# Patient Record
Sex: Female | Born: 1980 | Race: White | Hispanic: No | Marital: Married | Smoking: Never smoker
Health system: Southern US, Community
[De-identification: ages and names within clinical notes are randomized; demographics above are authoritative.]

## PROBLEM LIST (undated history)

## (undated) DIAGNOSIS — Z789 Other specified health status: Secondary | ICD-10-CM

## (undated) DIAGNOSIS — Z8619 Personal history of other infectious and parasitic diseases: Secondary | ICD-10-CM

## (undated) HISTORY — DX: Personal history of other infectious and parasitic diseases: Z86.19

## (undated) HISTORY — PX: WISDOM TOOTH EXTRACTION: SHX21

---
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
## 2005-12-25 ENCOUNTER — Other Ambulatory Visit: Admission: RE | Admit: 2005-12-25 | Discharge: 2005-12-25 | Payer: Self-pay | Admitting: Obstetrics and Gynecology

## 2008-05-08 ENCOUNTER — Inpatient Hospital Stay (HOSPITAL_COMMUNITY): Admission: AD | Admit: 2008-05-08 | Discharge: 2008-05-11 | Payer: Self-pay | Admitting: Obstetrics and Gynecology

## 2010-01-31 IMAGING — US US FETAL BPP W/O NONSTRESS
1 series · 14 of 17 positions shown · non-contrast
Comparison: none

OBSTETRICAL ULTRASOUND:
 This ultrasound exam was performed in the [HOSPITAL] Ultrasound Department.  The OB US report was generated in the AS system, and faxed to the ordering physician.  This report is also available in [REDACTED] PACS.

[Series 1: us fetal bpp w/o nonstress · non-contrast · 14 of 17 slices shown]
[im 1/17]
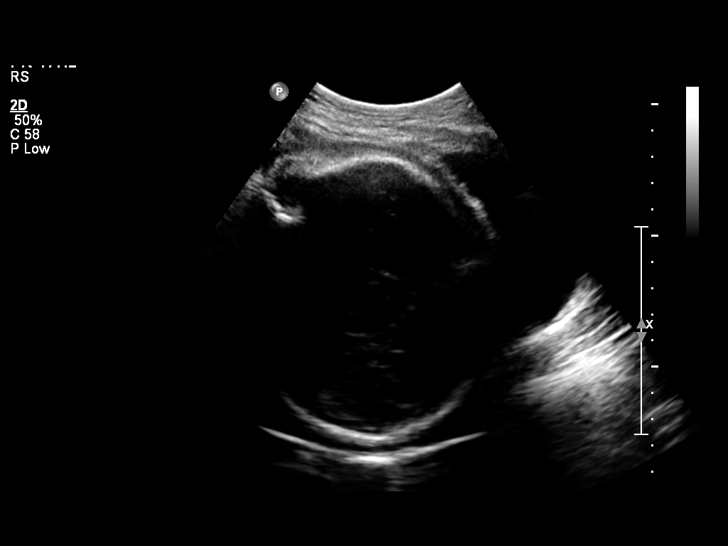
[im 2/17]
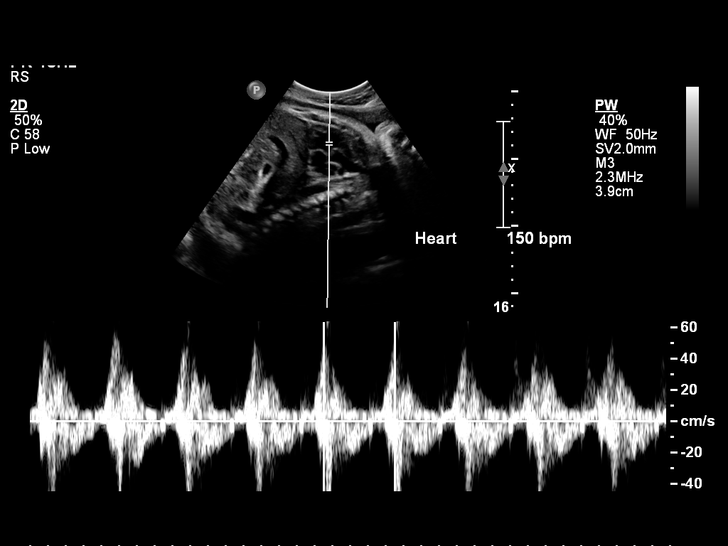
[im 4/17]
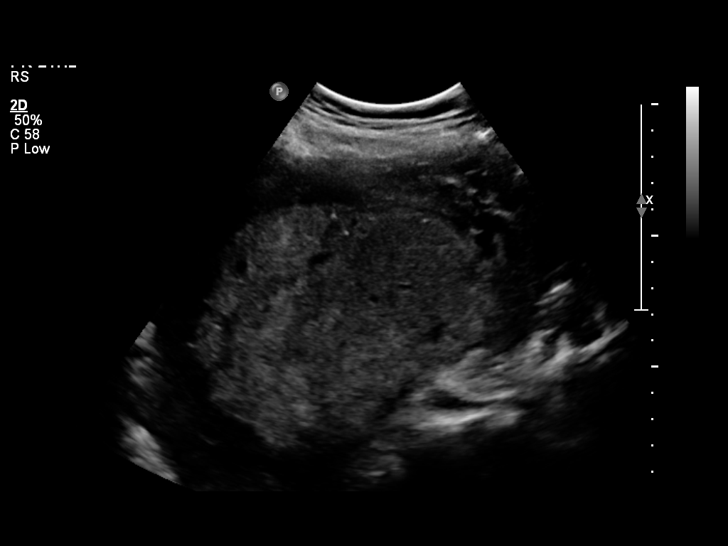
[im 5/17]
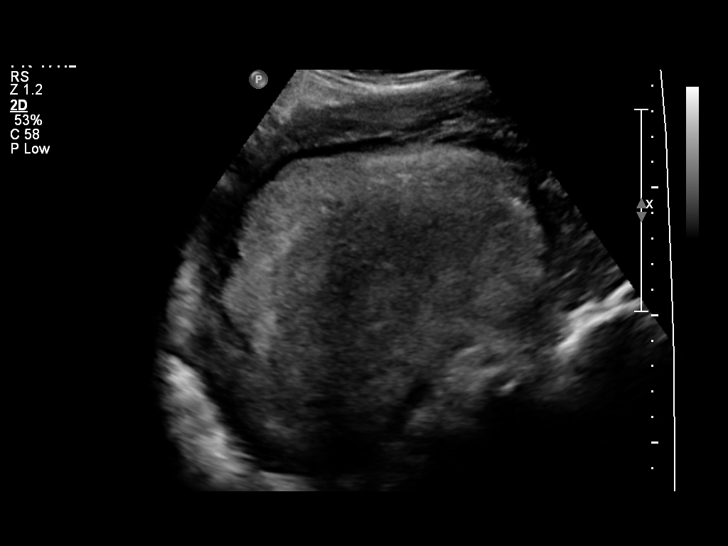
[im 6/17]
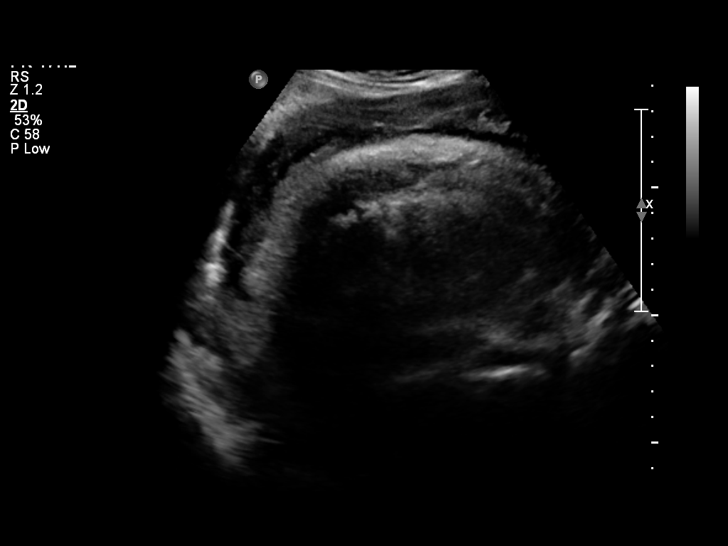
[im 7/17]
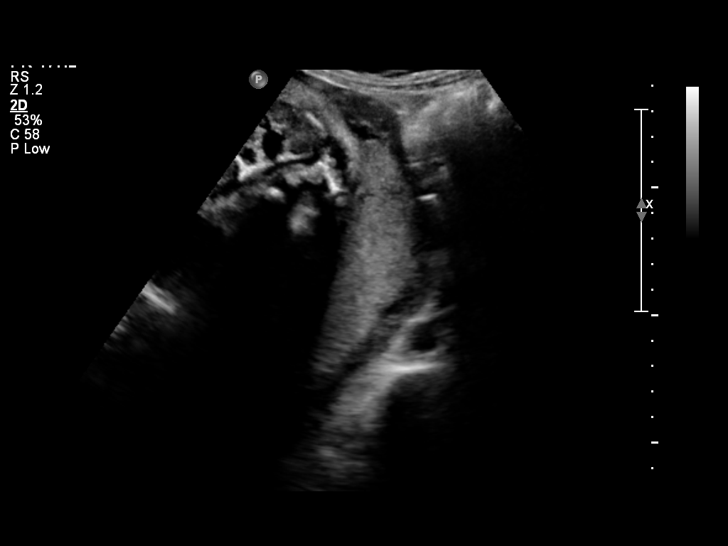
[im 8/17]
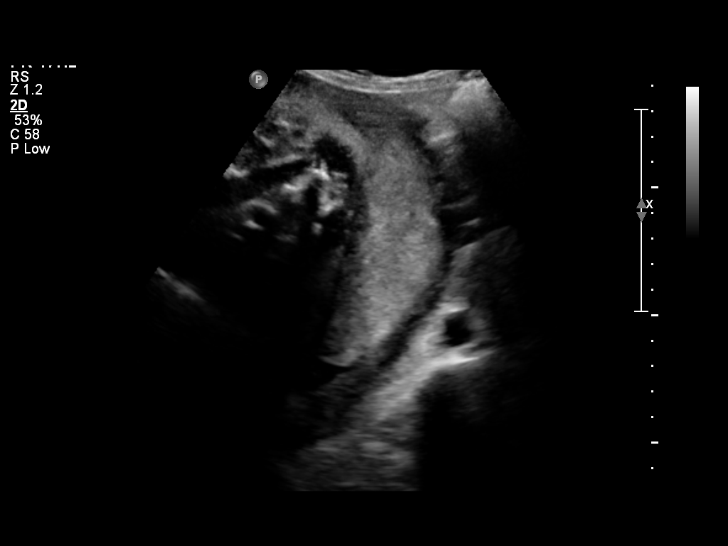
[im 10/17]
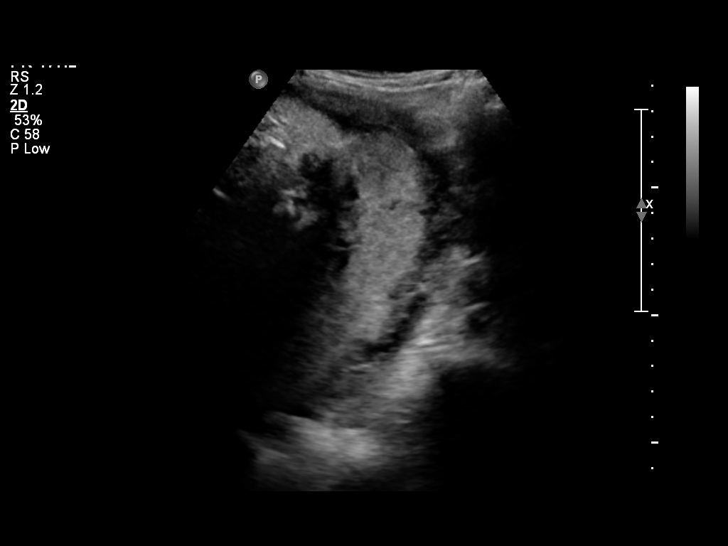
[im 11/17]
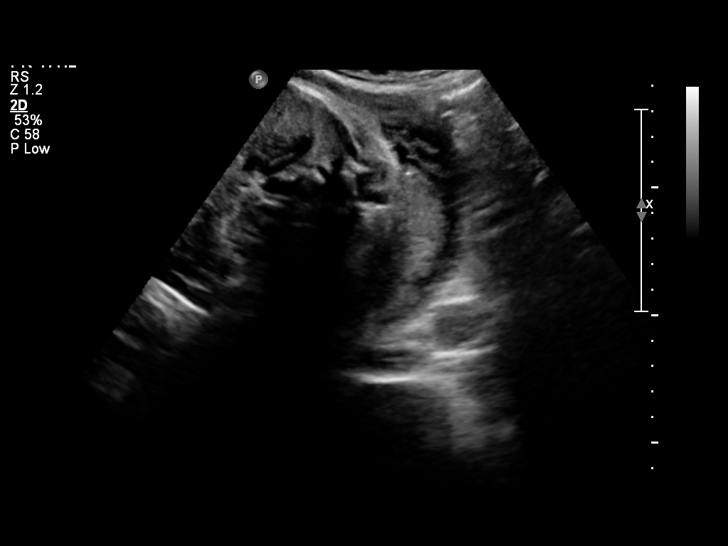
[im 12/17]
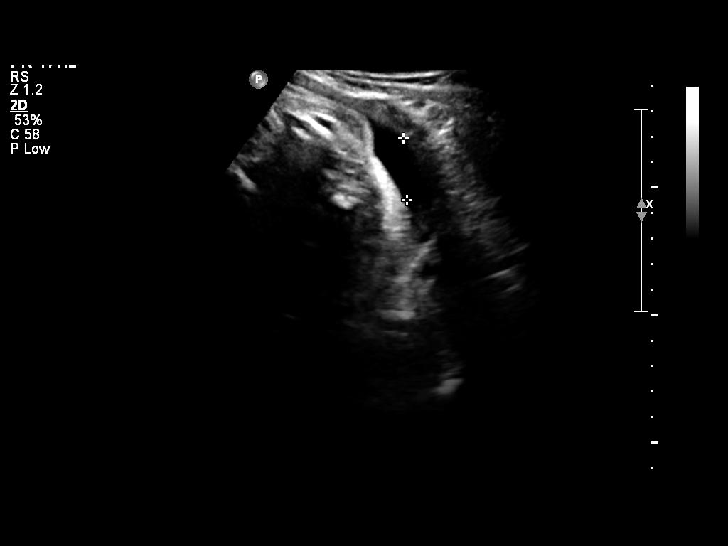
[im 13/17]
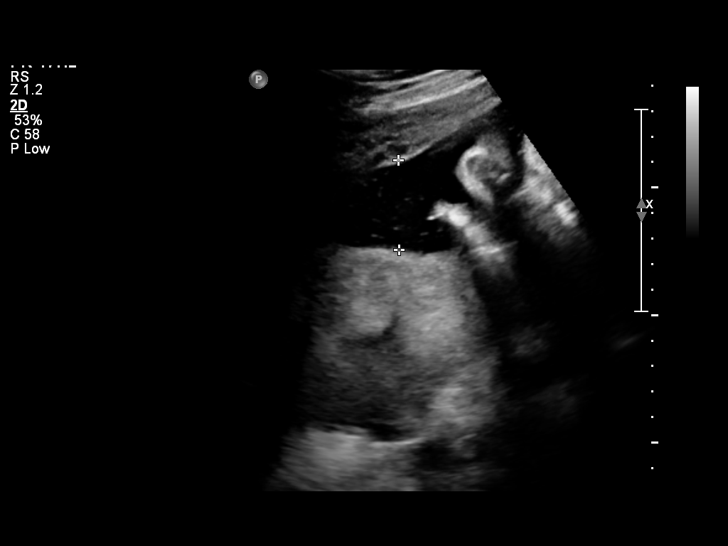
[im 14/17]
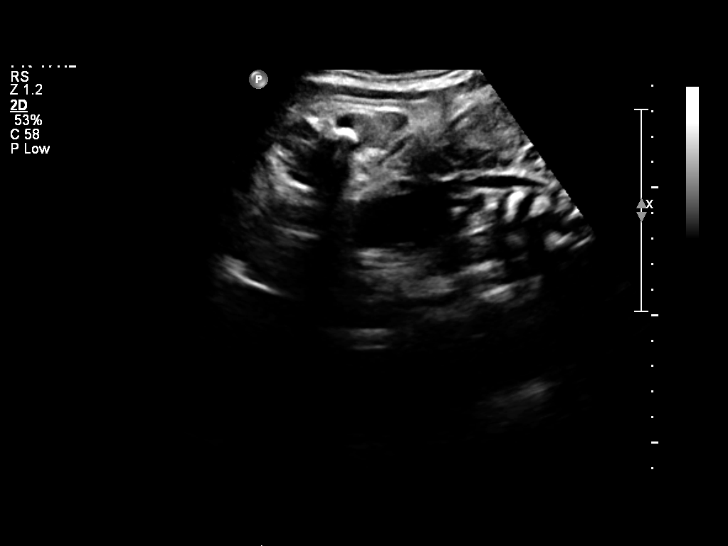
[im 16/17]
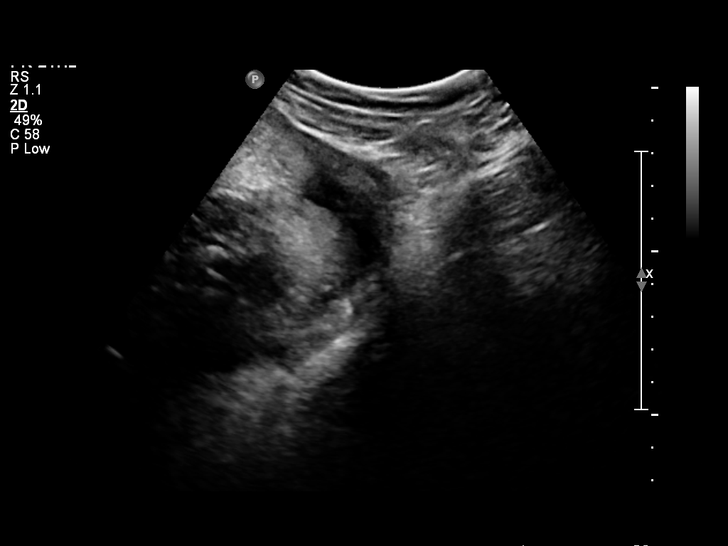
[im 17/17]
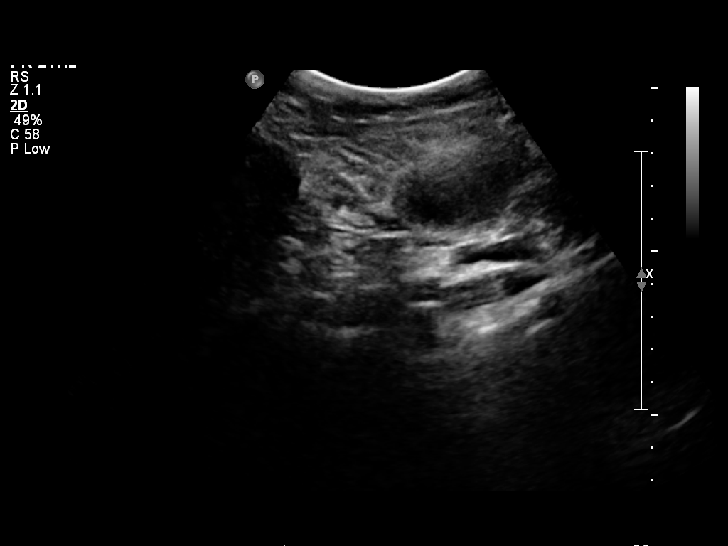

[14 of 17 positions shown; findings below may reference images not displayed]

IMPRESSION: See AS Obstetric US report.

## 2010-10-06 ENCOUNTER — Inpatient Hospital Stay (HOSPITAL_COMMUNITY)
Admission: AD | Admit: 2010-10-06 | Discharge: 2010-10-07 | Payer: Self-pay | Source: Home / Self Care | Attending: Obstetrics and Gynecology | Admitting: Obstetrics and Gynecology

## 2010-10-08 LAB — CBC
HCT: 38.7 % (ref 36.0–46.0)
HCT: 31.3 % — ABNORMAL LOW (ref 36.0–46.0)
Hemoglobin: 13.5 g/dL (ref 12.0–15.0)
Hemoglobin: 10.4 g/dL — ABNORMAL LOW (ref 12.0–15.0)
MCH: 32.3 pg (ref 26.0–34.0)
MCH: 31.5 pg (ref 26.0–34.0)
MCHC: 34.9 g/dL (ref 30.0–36.0)
MCHC: 33.2 g/dL (ref 30.0–36.0)
MCV: 92.6 fL (ref 78.0–100.0)
MCV: 94.8 fL (ref 78.0–100.0)
Platelets: 222 10*3/uL (ref 150–400)
Platelets: 193 10*3/uL (ref 150–400)
RBC: 4.18 MIL/uL (ref 3.87–5.11)
RBC: 3.3 MIL/uL — ABNORMAL LOW (ref 3.87–5.11)
RDW: 12.6 % (ref 11.5–15.5)
RDW: 13 % (ref 11.5–15.5)
WBC: 18.4 10*3/uL — ABNORMAL HIGH (ref 4.0–10.5)
WBC: 11.7 10*3/uL — ABNORMAL HIGH (ref 4.0–10.5)

## 2010-10-08 LAB — ABO/RH: ABO/RH(D): O POS

## 2010-10-08 LAB — RPR: RPR Ser Ql: NONREACTIVE

## 2012-04-22 LAB — OB RESULTS CONSOLE ABO/RH: RH Type: POSITIVE

## 2012-04-22 LAB — OB RESULTS CONSOLE RPR: RPR: NONREACTIVE

## 2012-04-22 LAB — OB RESULTS CONSOLE GC/CHLAMYDIA
Chlamydia: NEGATIVE
Gonorrhea: NEGATIVE

## 2012-04-22 LAB — OB RESULTS CONSOLE HEPATITIS B SURFACE ANTIGEN: Hepatitis B Surface Ag: NEGATIVE

## 2012-04-22 LAB — OB RESULTS CONSOLE HIV ANTIBODY (ROUTINE TESTING): HIV: NONREACTIVE

## 2012-04-22 LAB — OB RESULTS CONSOLE RUBELLA ANTIBODY, IGM: Rubella: IMMUNE

## 2012-04-22 LAB — OB RESULTS CONSOLE ANTIBODY SCREEN: Antibody Screen: NEGATIVE

## 2012-09-23 NOTE — L&D Delivery Note (Signed)
Delivery Note At  a viable female delivered via NSVD. Apgars 9 and 9 weight pending  Placenta status: spontaneously intact with 3 vessel cord  Cord:  with the following complications: short cord  Anesthesia: None  Episiotomy: none Lacerations: none Suture Repair: not applicable Est. Blood Loss (mL): 300  Mom to postpartum.  Baby to nursery-stable.  GREWAL,MICHELLE L 12/02/2012, 6:06 AM

## 2012-11-03 LAB — OB RESULTS CONSOLE GBS: GBS: NEGATIVE

## 2012-11-26 ENCOUNTER — Telehealth (HOSPITAL_COMMUNITY): Payer: Self-pay | Admitting: *Deleted

## 2012-11-26 ENCOUNTER — Encounter (HOSPITAL_COMMUNITY): Payer: Self-pay | Admitting: *Deleted

## 2012-11-26 NOTE — Telephone Encounter (Signed)
Preadmission screen  

## 2012-12-02 ENCOUNTER — Encounter (HOSPITAL_COMMUNITY): Payer: Self-pay

## 2012-12-02 ENCOUNTER — Inpatient Hospital Stay (HOSPITAL_COMMUNITY)
Admission: AD | Admit: 2012-12-02 | Discharge: 2012-12-03 | DRG: 775 | Disposition: A | Discharge status: Home / Self Care | Payer: Commercial Managed Care - PPO | Source: Ambulatory Visit | Attending: Obstetrics and Gynecology | Admitting: Obstetrics and Gynecology

## 2012-12-02 HISTORY — DX: Other specified health status: Z78.9

## 2012-12-02 LAB — CBC
HCT: 34.9 % — ABNORMAL LOW (ref 36.0–46.0)
Hemoglobin: 12 g/dL (ref 12.0–15.0)
MCV: 89.5 fL (ref 78.0–100.0)
Platelets: 216 10*3/uL (ref 150–400)
RBC: 3.9 MIL/uL (ref 3.87–5.11)
WBC: 15.5 10*3/uL — ABNORMAL HIGH (ref 4.0–10.5)

## 2012-12-02 MED ORDER — BENZOCAINE-MENTHOL 20-0.5 % EX AERO: 1.0000 "application " | INHALATION_SPRAY | CUTANEOUS | Status: DC | PRN

## 2012-12-02 MED ORDER — LIDOCAINE HCL (PF) 1 % IJ SOLN
30.0000 mL | INTRAMUSCULAR | Status: DC | PRN
  Filled 2012-12-02: qty 30

## 2012-12-02 MED ORDER — DIBUCAINE 1 % RE OINT
1.0000 "application " | TOPICAL_OINTMENT | RECTAL | Status: DC | PRN
  Administered 2012-12-02: 1 via RECTAL
  Filled 2012-12-02: qty 28

## 2012-12-02 MED ORDER — OXYTOCIN 40 UNITS IN LACTATED RINGERS INFUSION - SIMPLE MED: 1.0000 m[IU]/min | INTRAVENOUS | Status: DC

## 2012-12-02 MED ORDER — SIMETHICONE 80 MG PO CHEW: 80.0000 mg | CHEWABLE_TABLET | ORAL | Status: DC | PRN

## 2012-12-02 MED ORDER — FLEET ENEMA 7-19 GM/118ML RE ENEM: 1.0000 | ENEMA | Freq: Every day | RECTAL | Status: DC | PRN

## 2012-12-02 MED ORDER — OXYCODONE-ACETAMINOPHEN 5-325 MG PO TABS
1.0000 | ORAL_TABLET | ORAL | Status: DC | PRN
  Administered 2012-12-02: 1 via ORAL
  Filled 2012-12-02: qty 1

## 2012-12-02 MED ORDER — ONDANSETRON HCL 4 MG/2ML IJ SOLN
4.0000 mg | Freq: Four times a day (QID) | INTRAMUSCULAR | Status: DC | PRN
  Administered 2012-12-02: 4 mg via INTRAVENOUS
  Filled 2012-12-02: qty 2

## 2012-12-02 MED ORDER — PRENATAL MULTIVITAMIN CH
1.0000 | ORAL_TABLET | Freq: Every day | ORAL | Status: DC
  Administered 2012-12-02: 1 via ORAL
  Filled 2012-12-02 (×2): qty 1

## 2012-12-02 MED ORDER — LANOLIN HYDROUS EX OINT: TOPICAL_OINTMENT | CUTANEOUS | Status: DC | PRN

## 2012-12-02 MED ORDER — ZOLPIDEM TARTRATE 5 MG PO TABS: 5.0000 mg | ORAL_TABLET | Freq: Every evening | ORAL | Status: DC | PRN

## 2012-12-02 MED ORDER — MEDROXYPROGESTERONE ACETATE 150 MG/ML IM SUSP: 150.0000 mg | INTRAMUSCULAR | Status: DC | PRN

## 2012-12-02 MED ORDER — ACETAMINOPHEN 325 MG PO TABS: 650.0000 mg | ORAL_TABLET | ORAL | Status: DC | PRN

## 2012-12-02 MED ORDER — OXYTOCIN BOLUS FROM INFUSION
500.0000 mL | INTRAVENOUS | Status: DC
  Administered 2012-12-02: 500 mL via INTRAVENOUS

## 2012-12-02 MED ORDER — CITRIC ACID-SODIUM CITRATE 334-500 MG/5ML PO SOLN: 30.0000 mL | ORAL | Status: DC | PRN

## 2012-12-02 MED ORDER — MEASLES, MUMPS & RUBELLA VAC ~~LOC~~ INJ: 0.5000 mL | INJECTION | Freq: Once | SUBCUTANEOUS | Status: DC

## 2012-12-02 MED ORDER — DIPHENHYDRAMINE HCL 25 MG PO CAPS: 25.0000 mg | ORAL_CAPSULE | Freq: Four times a day (QID) | ORAL | Status: DC | PRN

## 2012-12-02 MED ORDER — IBUPROFEN 600 MG PO TABS
600.0000 mg | ORAL_TABLET | Freq: Four times a day (QID) | ORAL | Status: DC
  Administered 2012-12-02 – 2012-12-03 (×4): 600 mg via ORAL
  Filled 2012-12-02 (×4): qty 1

## 2012-12-02 MED ORDER — ONDANSETRON HCL 4 MG PO TABS: 4.0000 mg | ORAL_TABLET | ORAL | Status: DC | PRN

## 2012-12-02 MED ORDER — LACTATED RINGERS IV SOLN: 500.0000 mL | INTRAVENOUS | Status: DC | PRN

## 2012-12-02 MED ORDER — ONDANSETRON HCL 4 MG/2ML IJ SOLN: 4.0000 mg | INTRAMUSCULAR | Status: DC | PRN

## 2012-12-02 MED ORDER — WITCH HAZEL-GLYCERIN EX PADS
1.0000 "application " | MEDICATED_PAD | CUTANEOUS | Status: DC | PRN
  Administered 2012-12-02: 1 via TOPICAL

## 2012-12-02 MED ORDER — SENNOSIDES-DOCUSATE SODIUM 8.6-50 MG PO TABS
2.0000 | ORAL_TABLET | Freq: Every day | ORAL | Status: DC
  Administered 2012-12-02: 2 via ORAL

## 2012-12-02 MED ORDER — OXYCODONE-ACETAMINOPHEN 5-325 MG PO TABS: 1.0000 | ORAL_TABLET | ORAL | Status: DC | PRN

## 2012-12-02 MED ORDER — LACTATED RINGERS IV SOLN: INTRAVENOUS | Status: DC

## 2012-12-02 MED ORDER — TETANUS-DIPHTH-ACELL PERTUSSIS 5-2.5-18.5 LF-MCG/0.5 IM SUSP: 0.5000 mL | Freq: Once | INTRAMUSCULAR | Status: DC

## 2012-12-02 MED ORDER — IBUPROFEN 600 MG PO TABS
600.0000 mg | ORAL_TABLET | Freq: Four times a day (QID) | ORAL | Status: DC | PRN
Start: 2012-12-02 — End: 2012-12-02
  Administered 2012-12-02: 600 mg via ORAL
  Filled 2012-12-02: qty 1

## 2012-12-02 MED ORDER — FLEET ENEMA 7-19 GM/118ML RE ENEM: 1.0000 | ENEMA | RECTAL | Status: DC | PRN

## 2012-12-02 MED ORDER — OXYTOCIN 40 UNITS IN LACTATED RINGERS INFUSION - SIMPLE MED
62.5000 mL/h | INTRAVENOUS | Status: DC
  Filled 2012-12-02: qty 1000

## 2012-12-02 MED ORDER — BISACODYL 10 MG RE SUPP: 10.0000 mg | Freq: Every day | RECTAL | Status: DC | PRN

## 2012-12-02 NOTE — H&P (Signed)
32 year old G 3 P 2002 at 26 w 3 days presents in active labor. PNC See [place]. NSVD x 2 GBBS is negative Afebrile Vital signs stable General alert and oriented Lung CTAB Car RRR Cervix is C/rim /-1 AROM Clear Vertex  IMPRESSION: IUP at 40 w 3 days  PLAN: Labor Anticipate NSVD

## 2012-12-02 NOTE — Progress Notes (Signed)
UR chart review completed.  

## 2012-12-02 NOTE — MAU Note (Signed)
Ctx every 4 minutes since 0230 am. Denies leaking of fluid or vaginal bleeding. Was 3 cm in office yesterday.

## 2012-12-02 NOTE — Progress Notes (Signed)
Post Partum Day 0 Subjective: no complaints, up ad lib and tolerating PO  Objective: Blood pressure 120/68, pulse 85, temperature 98.3 F (36.8 C), temperature source Oral, resp. rate 20, height 5\' 4"  (1.626 m), weight 153 lb (69.4 kg), last menstrual period 02/23/2012, SpO2 98.00%, unknown if currently breastfeeding.  Physical Exam:  General: alert and cooperative Lochia: appropriate Uterine Fundus: firm Incision: perineum intact DVT Evaluation: No evidence of DVT seen on physical exam. No significant calf/ankle edema.   Recent Labs  12/02/12 0454  HGB 12.0  HCT 34.9*    Assessment/Plan: Plan for discharge tomorrow   LOS: 0 days   CURTIS,CAROL G 12/02/2012, 8:36 AM

## 2012-12-03 ENCOUNTER — Inpatient Hospital Stay (HOSPITAL_COMMUNITY): Admission: RE | Admit: 2012-12-03 | Payer: Commercial Managed Care - PPO | Source: Ambulatory Visit

## 2012-12-03 LAB — CBC
Hemoglobin: 10.8 g/dL — ABNORMAL LOW (ref 12.0–15.0)
MCHC: 32.8 g/dL (ref 30.0–36.0)
Platelets: 204 10*3/uL (ref 150–400)
RBC: 3.59 MIL/uL — ABNORMAL LOW (ref 3.87–5.11)

## 2012-12-03 MED ORDER — IBUPROFEN 600 MG PO TABS: 600.0000 mg | ORAL_TABLET | Freq: Four times a day (QID) | ORAL | Status: AC

## 2012-12-03 MED ORDER — OXYCODONE-ACETAMINOPHEN 5-325 MG PO TABS: 1.0000 | ORAL_TABLET | ORAL | Status: DC | PRN

## 2012-12-03 NOTE — Discharge Summary (Signed)
Obstetric Discharge Summary Reason for Admission: onset of labor Prenatal Procedures: ultrasound Intrapartum Procedures: spontaneous vaginal delivery Postpartum Procedures: none Complications-Operative and Postpartum: none Hemoglobin  Date Value Range Status  12/03/2012 10.8* 12.0 - 15.0 g/dL Final     HCT  Date Value Range Status  12/03/2012 32.9* 36.0 - 46.0 % Final    Physical Exam:  General: alert and cooperative Lochia: appropriate Uterine Fundus: firm Incision: n/a DVT Evaluation: No evidence of DVT seen on physical exam.  Discharge Diagnoses: Term Pregnancy-delivered  Discharge Information: Date: 12/03/2012 Activity: pelvic rest Diet: routine Medications: PNV, Ibuprofen and Percocet Condition: stable Instructions: refer to practice specific booklet Discharge to: home Follow-up Information   Schedule an appointment as soon as possible for a visit in 6 weeks to follow up.      Newborn Data: Live born female  Birth Weight: 7 lb 15.7 oz (3620 g) APGAR: 9, 9  Home with mother.  ADKINS,GRETCHEN 12/03/2012, 9:27 AM

## 2013-07-13 ENCOUNTER — Other Ambulatory Visit: Payer: Self-pay | Admitting: Geriatric Medicine

## 2013-07-13 DIAGNOSIS — R300 Dysuria: Secondary | ICD-10-CM

## 2013-07-14 ENCOUNTER — Other Ambulatory Visit: Payer: Self-pay | Admitting: Geriatric Medicine

## 2013-07-14 ENCOUNTER — Ambulatory Visit
Admission: RE | Admit: 2013-07-14 | Discharge: 2013-07-14 | Disposition: A | Discharge status: Home / Self Care | Payer: Commercial Managed Care - PPO | Source: Ambulatory Visit | Attending: Geriatric Medicine | Admitting: Geriatric Medicine

## 2013-07-14 DIAGNOSIS — R300 Dysuria: Secondary | ICD-10-CM

## 2013-08-12 ENCOUNTER — Other Ambulatory Visit: Payer: Self-pay | Admitting: Obstetrics and Gynecology

## 2013-08-12 DIAGNOSIS — N63 Unspecified lump in unspecified breast: Secondary | ICD-10-CM

## 2013-08-17 ENCOUNTER — Ambulatory Visit
Admission: RE | Admit: 2013-08-17 | Discharge: 2013-08-17 | Disposition: A | Discharge status: Home / Self Care | Payer: Commercial Managed Care - PPO | Source: Ambulatory Visit | Attending: Obstetrics and Gynecology | Admitting: Obstetrics and Gynecology

## 2013-08-17 DIAGNOSIS — N63 Unspecified lump in unspecified breast: Secondary | ICD-10-CM

## 2014-07-25 ENCOUNTER — Encounter (HOSPITAL_COMMUNITY): Payer: Self-pay

## 2014-09-20 LAB — OB RESULTS CONSOLE GC/CHLAMYDIA
CHLAMYDIA, DNA PROBE: NEGATIVE
Chlamydia: NEGATIVE
GC PROBE AMP, GENITAL: NEGATIVE
Gonorrhea: NEGATIVE

## 2014-09-20 LAB — OB RESULTS CONSOLE HIV ANTIBODY (ROUTINE TESTING): HIV: NONREACTIVE

## 2014-09-20 LAB — OB RESULTS CONSOLE RUBELLA ANTIBODY, IGM: Rubella: IMMUNE

## 2014-09-20 LAB — OB RESULTS CONSOLE ABO/RH: RH Type: POSITIVE

## 2014-09-20 LAB — OB RESULTS CONSOLE HEPATITIS B SURFACE ANTIGEN: Hepatitis B Surface Ag: NEGATIVE

## 2014-09-20 LAB — OB RESULTS CONSOLE RPR: RPR: NONREACTIVE

## 2014-09-23 NOTE — L&D Delivery Note (Signed)
Delivery Note  SVD viable female Apgars 8,9 over intact perineum.  Placenta delivered spontaneously intact with 3VC. good support and hemostasis noted and R/V exam confirms.  PH art was not done.  [place] cord blood was not done.  Mother and baby were doing well.  EBL 100cc  Candice Camp, MD

## 2015-03-31 LAB — OB RESULTS CONSOLE GBS: GBS: NEGATIVE

## 2015-04-08 IMAGING — US US RENAL
1 series · 14 of 25 positions shown · non-contrast
Comparison: None.

CLINICAL DATA: Dysuria

EXAM:
RENAL/URINARY TRACT ULTRASOUND COMPLETE

[Series 1: us renal · 0.26mm/px · 14 of 31 slices shown]
[im 1/31]
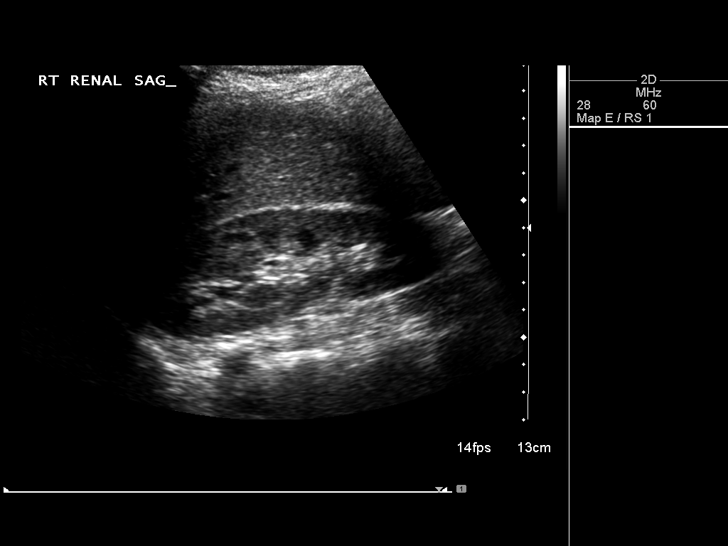
[im 3/31]
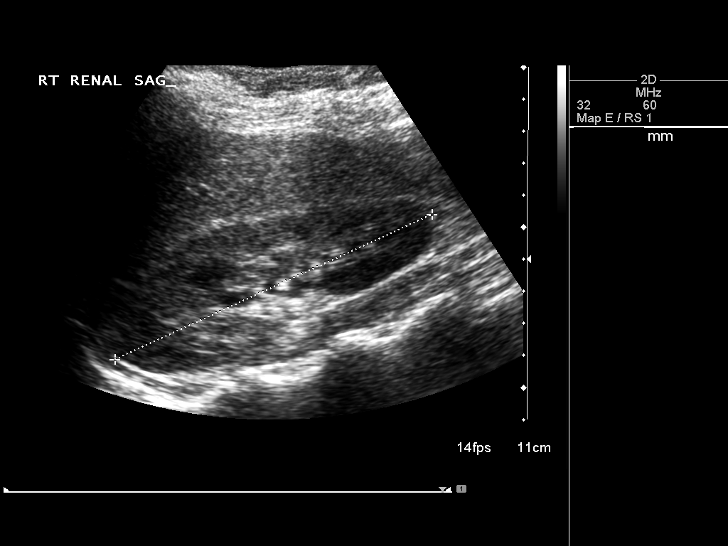
[im 6/31]
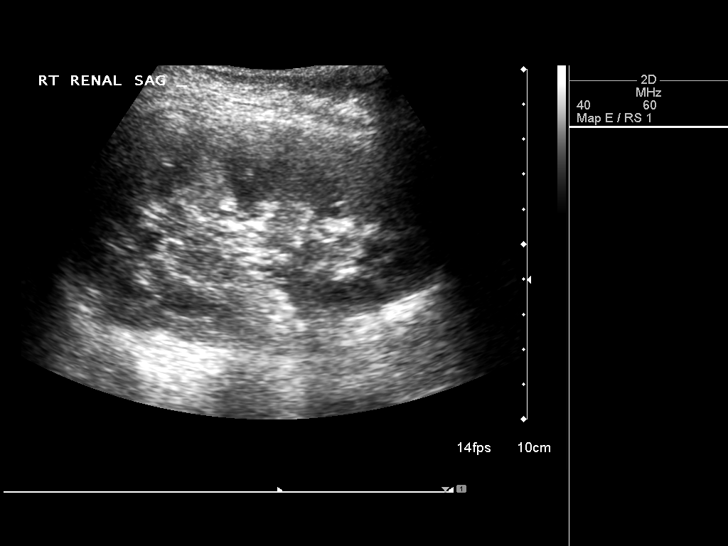
[im 8/31]
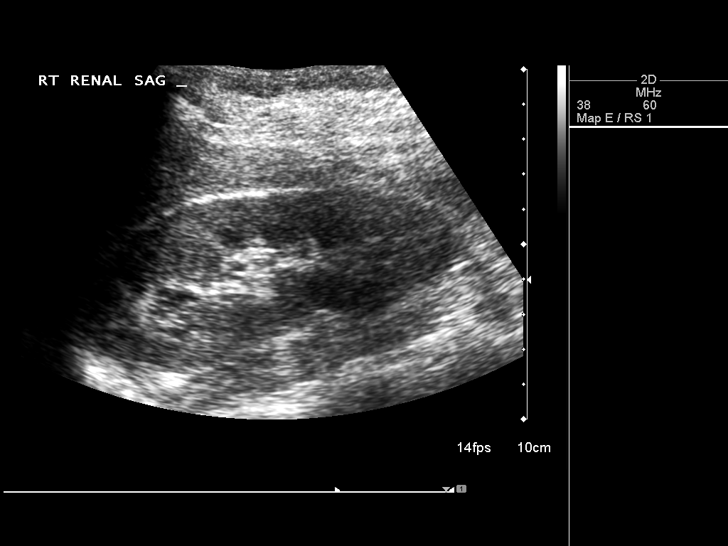
[im 11/31]
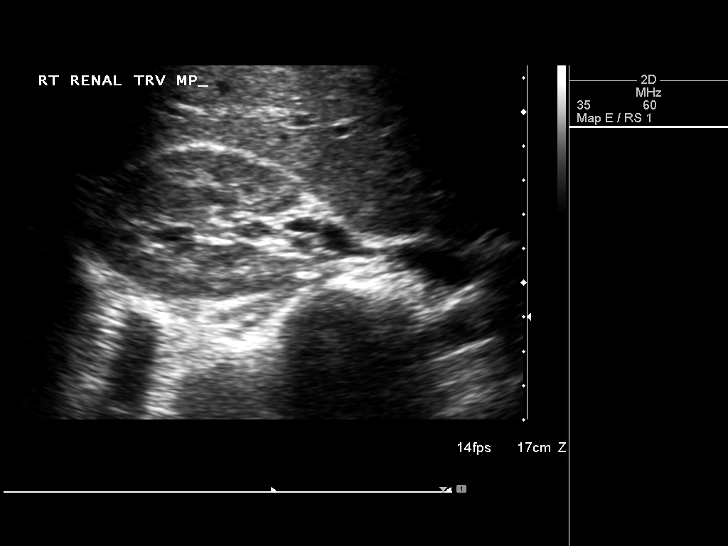
[im 12/31]
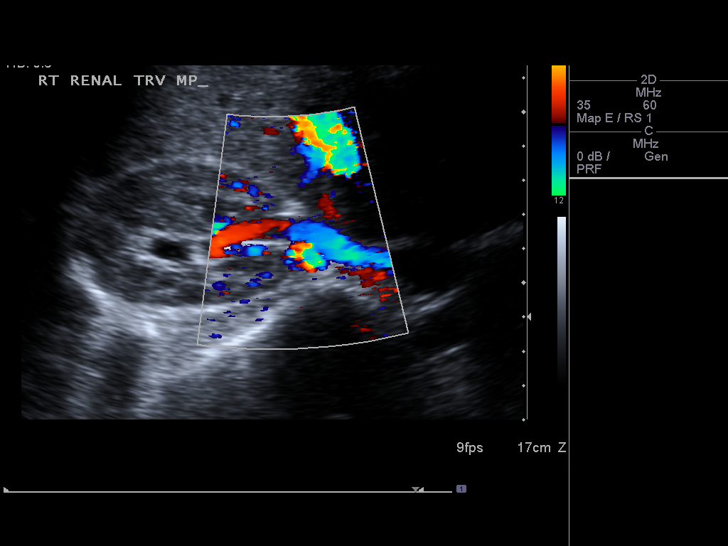
[im 14/31]
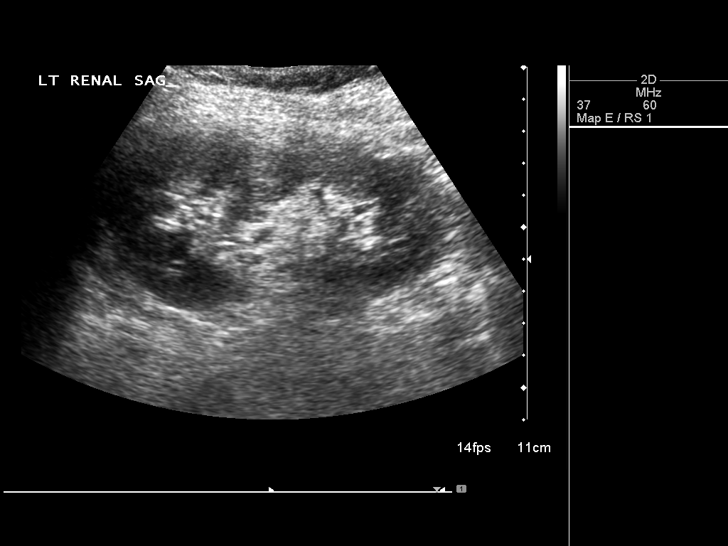
[im 17/31]
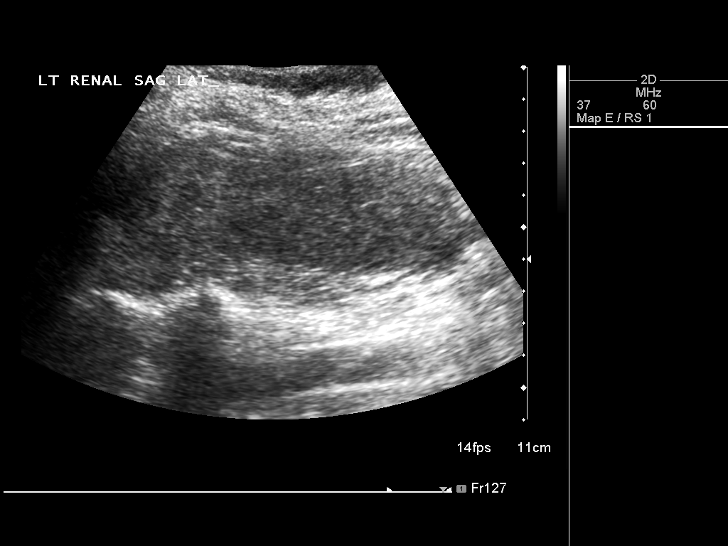
[im 19/31]
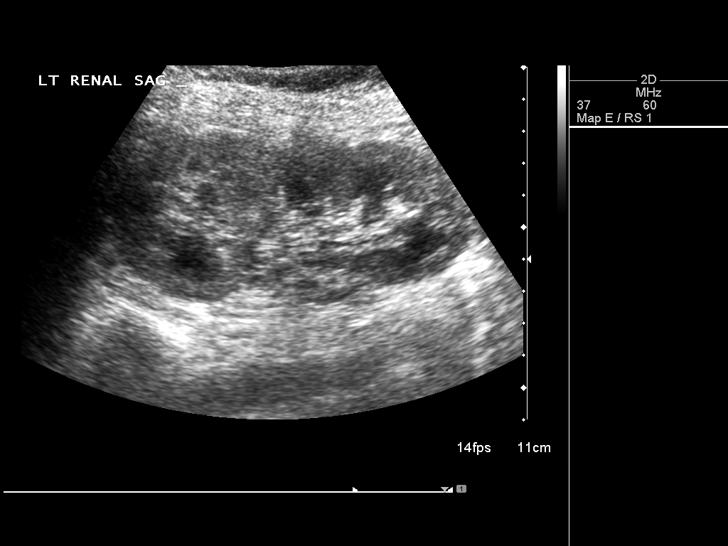
[im 21/31]
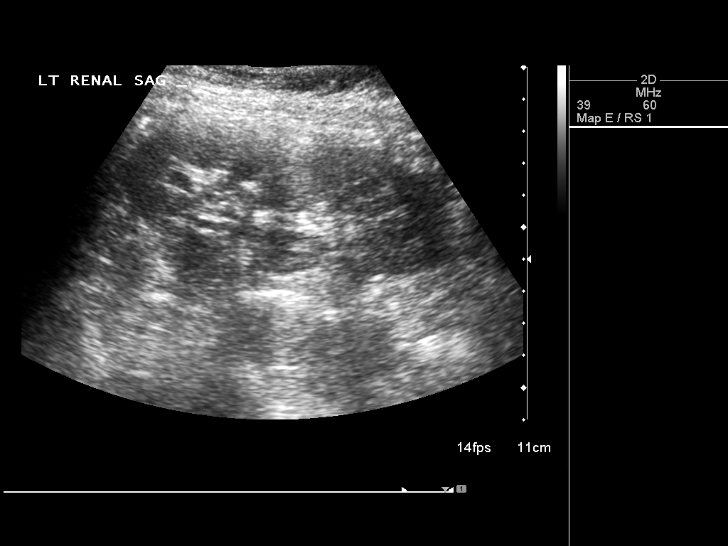
[im 23/31]
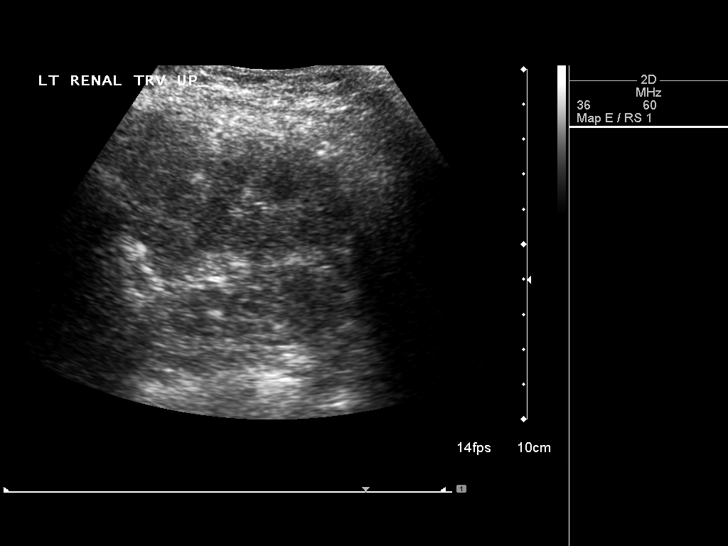
[im 26/31]
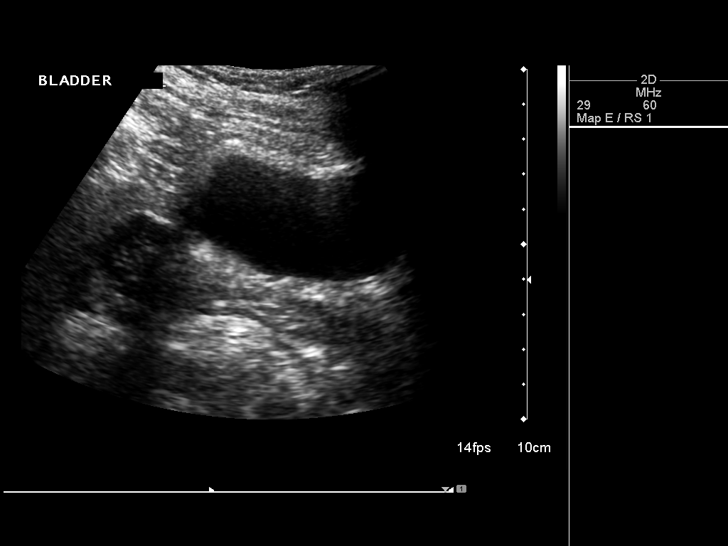
[im 28/31]
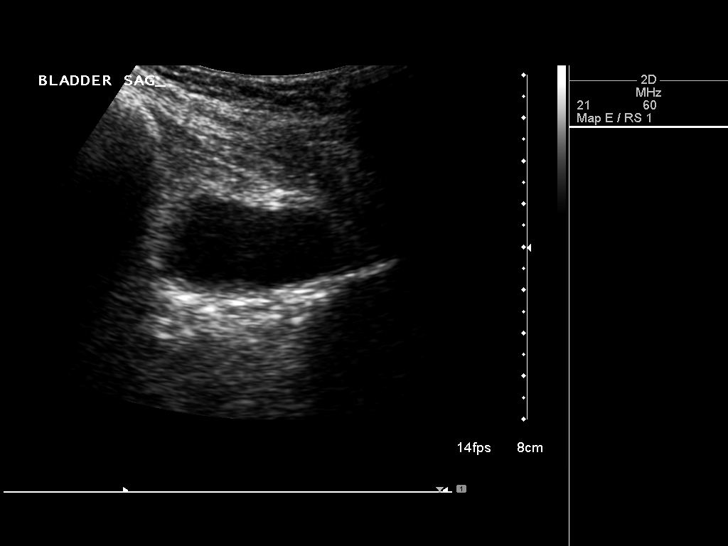
[im 31/31]
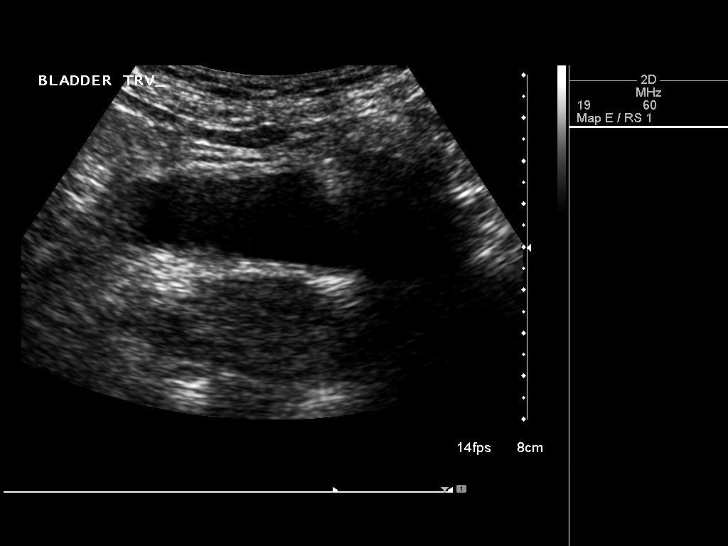

[14 of 25 positions shown; findings below may reference images not displayed]

FINDINGS: Right Kidney

Length: The right kidney measures 11.2 cm sagittally.. No
hydronephrosis is seen and no renal calculi are noted.

Left Kidney

Length: The left kidney measures 11.4 cm.. No hydronephrosis is
noted and no renal calculi are seen.

Bladder

The urinary bladder is unremarkable.
IMPRESSION: Negative ultrasound of the kidneys.

## 2015-04-15 ENCOUNTER — Inpatient Hospital Stay (HOSPITAL_COMMUNITY)
Admission: AD | Admit: 2015-04-15 | Discharge: 2015-04-17 | DRG: 775 | Disposition: A | Discharge status: Home / Self Care | Payer: Commercial Managed Care - PPO | Source: Ambulatory Visit | Attending: Obstetrics and Gynecology | Admitting: Obstetrics and Gynecology

## 2015-04-15 ENCOUNTER — Encounter (HOSPITAL_COMMUNITY): Payer: Self-pay

## 2015-04-15 DIAGNOSIS — N858 Other specified noninflammatory disorders of uterus: Secondary | ICD-10-CM | POA: Diagnosis present

## 2015-04-15 DIAGNOSIS — IMO0001 Reserved for inherently not codable concepts without codable children: Secondary | ICD-10-CM

## 2015-04-15 DIAGNOSIS — Z3A38 38 weeks gestation of pregnancy: Secondary | ICD-10-CM | POA: Diagnosis present

## 2015-04-15 LAB — CBC
HCT: 35.6 % — ABNORMAL LOW (ref 36.0–46.0)
HEMOGLOBIN: 11.9 g/dL — AB (ref 12.0–15.0)
MCH: 31.2 pg (ref 26.0–34.0)
MCHC: 33.4 g/dL (ref 30.0–36.0)
MCV: 93.2 fL (ref 78.0–100.0)
Platelets: 181 10*3/uL (ref 150–400)
RBC: 3.82 MIL/uL — ABNORMAL LOW (ref 3.87–5.11)
RDW: 13.5 % (ref 11.5–15.5)
WBC: 10.5 10*3/uL (ref 4.0–10.5)

## 2015-04-15 LAB — TYPE AND SCREEN
ABO/RH(D): O POS
ANTIBODY SCREEN: NEGATIVE

## 2015-04-15 MED ORDER — LIDOCAINE HCL (PF) 1 % IJ SOLN
30.0000 mL | INTRAMUSCULAR | Status: DC | PRN
  Filled 2015-04-15: qty 30

## 2015-04-15 MED ORDER — DIBUCAINE 1 % RE OINT: 1.0000 "application " | TOPICAL_OINTMENT | RECTAL | Status: DC | PRN

## 2015-04-15 MED ORDER — LACTATED RINGERS IV SOLN: 500.0000 mL | INTRAVENOUS | Status: DC | PRN

## 2015-04-15 MED ORDER — TETANUS-DIPHTH-ACELL PERTUSSIS 5-2.5-18.5 LF-MCG/0.5 IM SUSP: 0.5000 mL | Freq: Once | INTRAMUSCULAR | Status: DC

## 2015-04-15 MED ORDER — BENZOCAINE-MENTHOL 20-0.5 % EX AERO
1.0000 "application " | INHALATION_SPRAY | CUTANEOUS | Status: DC | PRN
  Administered 2015-04-15: 1 via TOPICAL
  Filled 2015-04-15: qty 56

## 2015-04-15 MED ORDER — ACETAMINOPHEN 325 MG PO TABS
650.0000 mg | ORAL_TABLET | ORAL | Status: DC | PRN
  Administered 2015-04-16 (×3): 650 mg via ORAL
  Filled 2015-04-15 (×2): qty 2

## 2015-04-15 MED ORDER — ONDANSETRON HCL 4 MG PO TABS: 4.0000 mg | ORAL_TABLET | ORAL | Status: DC | PRN

## 2015-04-15 MED ORDER — PRENATAL MULTIVITAMIN CH
1.0000 | ORAL_TABLET | Freq: Every day | ORAL | Status: DC
  Administered 2015-04-16: 1 via ORAL
  Filled 2015-04-15: qty 1

## 2015-04-15 MED ORDER — PROMETHAZINE HCL 25 MG/ML IJ SOLN
12.5000 mg | INTRAMUSCULAR | Status: DC | PRN
  Administered 2015-04-15: 12.5 mg via INTRAVENOUS
  Filled 2015-04-15: qty 1

## 2015-04-15 MED ORDER — OXYTOCIN BOLUS FROM INFUSION
500.0000 mL | INTRAVENOUS | Status: DC
  Administered 2015-04-15: 500 mL via INTRAVENOUS

## 2015-04-15 MED ORDER — ONDANSETRON HCL 4 MG/2ML IJ SOLN: 4.0000 mg | INTRAMUSCULAR | Status: DC | PRN

## 2015-04-15 MED ORDER — WITCH HAZEL-GLYCERIN EX PADS
1.0000 "application " | MEDICATED_PAD | CUTANEOUS | Status: DC | PRN
  Administered 2015-04-16: 1 via TOPICAL

## 2015-04-15 MED ORDER — IBUPROFEN 600 MG PO TABS
600.0000 mg | ORAL_TABLET | Freq: Four times a day (QID) | ORAL | Status: DC
  Administered 2015-04-15 – 2015-04-17 (×6): 600 mg via ORAL
  Filled 2015-04-15 (×6): qty 1

## 2015-04-15 MED ORDER — MEASLES, MUMPS & RUBELLA VAC ~~LOC~~ INJ
0.5000 mL | INJECTION | Freq: Once | SUBCUTANEOUS | Status: DC
  Filled 2015-04-15: qty 0.5

## 2015-04-15 MED ORDER — OXYCODONE-ACETAMINOPHEN 5-325 MG PO TABS
1.0000 | ORAL_TABLET | ORAL | Status: DC | PRN
  Administered 2015-04-17: 1 via ORAL
  Filled 2015-04-15: qty 1

## 2015-04-15 MED ORDER — SENNOSIDES-DOCUSATE SODIUM 8.6-50 MG PO TABS
2.0000 | ORAL_TABLET | ORAL | Status: DC
  Administered 2015-04-16: 2 via ORAL
  Filled 2015-04-15: qty 2

## 2015-04-15 MED ORDER — ONDANSETRON HCL 4 MG/2ML IJ SOLN: 4.0000 mg | Freq: Four times a day (QID) | INTRAMUSCULAR | Status: DC | PRN

## 2015-04-15 MED ORDER — ZOLPIDEM TARTRATE 5 MG PO TABS: 5.0000 mg | ORAL_TABLET | Freq: Every evening | ORAL | Status: DC | PRN

## 2015-04-15 MED ORDER — FLEET ENEMA 7-19 GM/118ML RE ENEM: 1.0000 | ENEMA | RECTAL | Status: DC | PRN

## 2015-04-15 MED ORDER — OXYTOCIN 40 UNITS IN LACTATED RINGERS INFUSION - SIMPLE MED
62.5000 mL/h | INTRAVENOUS | Status: DC
  Filled 2015-04-15: qty 1000

## 2015-04-15 MED ORDER — OXYCODONE-ACETAMINOPHEN 5-325 MG PO TABS: 2.0000 | ORAL_TABLET | ORAL | Status: DC | PRN

## 2015-04-15 MED ORDER — ACETAMINOPHEN 325 MG PO TABS: 650.0000 mg | ORAL_TABLET | ORAL | Status: DC | PRN

## 2015-04-15 MED ORDER — LACTATED RINGERS IV SOLN
INTRAVENOUS | Status: DC
  Administered 2015-04-15: 16:00:00 via INTRAVENOUS

## 2015-04-15 MED ORDER — OXYCODONE-ACETAMINOPHEN 5-325 MG PO TABS: 1.0000 | ORAL_TABLET | ORAL | Status: DC | PRN

## 2015-04-15 MED ORDER — SIMETHICONE 80 MG PO CHEW: 80.0000 mg | CHEWABLE_TABLET | ORAL | Status: DC | PRN

## 2015-04-15 MED ORDER — MEDROXYPROGESTERONE ACETATE 150 MG/ML IM SUSP: 150.0000 mg | INTRAMUSCULAR | Status: DC | PRN

## 2015-04-15 MED ORDER — DIPHENHYDRAMINE HCL 25 MG PO CAPS: 25.0000 mg | ORAL_CAPSULE | Freq: Four times a day (QID) | ORAL | Status: DC | PRN

## 2015-04-15 MED ORDER — LANOLIN HYDROUS EX OINT: TOPICAL_OINTMENT | CUTANEOUS | Status: DC | PRN

## 2015-04-15 MED ORDER — CITRIC ACID-SODIUM CITRATE 334-500 MG/5ML PO SOLN
30.0000 mL | ORAL | Status: DC | PRN
Start: 2015-04-15 — End: 2015-04-15

## 2015-04-15 NOTE — MAU Note (Signed)
HMitchell, Rn charge called [id number] status. No ETA given.

## 2015-04-15 NOTE — H&P (Signed)
Morgan Adams is a 34 y.o. female presenting for labor.  Pregnancy uncomplicated.  GBS -. History OB History    Gravida Para Term Preterm AB TAB SAB Ectopic Multiple Living   Past Medical History  Diagnosis Date  . Hx of varicella   . Medical history non-contributory    Past Surgical History  Procedure Laterality Date  . Wisdom tooth extraction     Family History: family history includes Cancer in her paternal grandfather; Depression in her paternal grandmother; Diabetes in her paternal grandfather; Heart disease in her brother, father, and maternal grandfather. Social History:  reports that she has never smoked. She has never used smokeless tobacco. She reports that she does not drink alcohol or use illicit drugs.   Prenatal Transfer Tool  Maternal Diabetes: No Genetic Screening: Normal Maternal Ultrasounds/Referrals: Normal Fetal Ultrasounds or other Referrals:  None Maternal Substance Abuse:  No Significant Maternal Medications:  None Significant Maternal Lab Results:  None Other Comments:  None  ROS  Dilation: 6.5 Effacement (%): 100 Station: 0 Exam by:: K.WIlson,RN Blood pressure 122/70, pulse 98, temperature 98.1 F (36.7 C), resp. rate 18, unknown if currently breastfeeding. Exam Physical Exam  Prenatal labs: ABO, Rh: --/--/O POS (07/23 1625) Antibody: NEG (07/23 1625) Rubella:   RPR:    HBsAg:    HIV:    GBS:     Assessment/Plan: IUP at Term Active labor Anticipate SVD DL  Emitt Maglione C 1/61/0960, 5:27 PM

## 2015-04-15 NOTE — MAU Note (Signed)
Pt reports ctx on and off al day about 4-5 min apart. Denies  SROm reports some bloody show and good fetal movement

## 2015-04-16 LAB — CBC
HCT: 31.6 % — ABNORMAL LOW (ref 36.0–46.0)
Hemoglobin: 10.6 g/dL — ABNORMAL LOW (ref 12.0–15.0)
MCH: 31.3 pg (ref 26.0–34.0)
MCHC: 33.5 g/dL (ref 30.0–36.0)
MCV: 93.2 fL (ref 78.0–100.0)
PLATELETS: 172 10*3/uL (ref 150–400)
RBC: 3.39 MIL/uL — AB (ref 3.87–5.11)
RDW: 13.6 % (ref 11.5–15.5)
WBC: 13.7 10*3/uL — ABNORMAL HIGH (ref 4.0–10.5)

## 2015-04-16 LAB — RPR: RPR Ser Ql: NONREACTIVE

## 2015-04-16 NOTE — Progress Notes (Signed)
Pt c/o continuous discomfort in sacral area. No redness noted at this time, nor breakdown of skin. Skin blanches over the surface of sacral area.

## 2015-04-16 NOTE — Progress Notes (Signed)
Post Partum Day 1 Subjective: no complaints, up ad lib, voiding and tolerating PO  Objective: Blood pressure 91/55, pulse 92, temperature 97.8 F (36.6 C), temperature source Oral, resp. rate 18, height  (1.626 m), weight 155 lb (70.308 kg), SpO2 98 %, unknown if currently breastfeeding.  Physical Exam:  General: alert, cooperative, appears stated age and no distress Lochia: appropriate Uterine Fundus: firm Incision: healing well DVT Evaluation: No evidence of DVT seen on physical exam.   Recent Labs  04/15/15 1625 04/16/15 0540  HGB 11.9* 10.6*  HCT 35.6* 31.6*    Assessment/Plan: Plan for discharge tomorrow, Breastfeeding and Circumcision prior to discharge   LOS: 1 day   Morgan Adams C 04/16/2015, 9:50 AM

## 2015-04-17 NOTE — Discharge Summary (Signed)
Obstetric Discharge Summary Reason for Admission: onset of labor Prenatal Procedures: ultrasound Intrapartum Procedures: spontaneous vaginal delivery Postpartum Procedures: none Complications-Operative and Postpartum: none HEMOGLOBIN  Date Value Ref Range Status  04/16/2015 10.6* 12.0 - 15.0 g/dL Final   HCT  Date Value Ref Range Status  04/16/2015 31.6* 36.0 - 46.0 % Final    Physical Exam:  General: alert and cooperative Lochia: appropriate Uterine Fundus: firm Incision: perineum intact DVT Evaluation: No evidence of DVT seen on physical exam. Negative Homan's sign. No cords or calf tenderness. No significant calf/ankle edema.  Discharge Diagnoses: Term Pregnancy-delivered  Discharge Information: Date: 04/17/2015 Activity: pelvic rest Diet: routine Medications: PNV and Ibuprofen Condition: stable Instructions: refer to practice specific booklet Discharge to: home   Newborn Data: Live born female  Birth Weight: 7 lb 13.4 oz (3555 g) APGAR: 8, 9  Home with mother.  CURTIS,CAROL G 04/17/2015, 8:26 AM
